# Patient Record
Sex: Male | Born: 1970 | Race: Black or African American | Hispanic: No | Smoking: Never smoker
Health system: Southern US, Community
[De-identification: ages and names within clinical notes are randomized; demographics above are authoritative.]

## PROBLEM LIST (undated history)

## (undated) DIAGNOSIS — I10 Essential (primary) hypertension: Secondary | ICD-10-CM

---

## 2017-09-25 ENCOUNTER — Ambulatory Visit
Admission: EM | Admit: 2017-09-25 | Discharge: 2017-09-25 | Disposition: A | Discharge status: Home / Self Care | Payer: BLUE CROSS/BLUE SHIELD | Attending: Family Medicine | Admitting: Family Medicine

## 2017-09-25 ENCOUNTER — Ambulatory Visit (INDEPENDENT_AMBULATORY_CARE_PROVIDER_SITE_OTHER): Payer: BLUE CROSS/BLUE SHIELD

## 2017-09-25 ENCOUNTER — Encounter: Payer: Self-pay | Admitting: Emergency Medicine

## 2017-09-25 ENCOUNTER — Other Ambulatory Visit: Payer: Self-pay

## 2017-09-25 DIAGNOSIS — M79645 Pain in left finger(s): Secondary | ICD-10-CM

## 2017-09-25 DIAGNOSIS — Z041 Encounter for examination and observation following transport accident: Secondary | ICD-10-CM

## 2017-09-25 HISTORY — DX: Essential (primary) hypertension: I10

## 2017-09-25 MED ORDER — MELOXICAM 15 MG PO TABS: 15.0000 mg | ORAL_TABLET | Freq: Every day | ORAL | 0 refills | Status: AC | PRN
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
## 2017-09-25 NOTE — Discharge Instructions (Signed)
Rest.  Medication as directed.  Take care  Dr. Burdett Pinzon  

## 2017-10-21 ENCOUNTER — Other Ambulatory Visit: Payer: Self-pay | Admitting: Family Medicine

## 2017-11-06 ENCOUNTER — Other Ambulatory Visit: Payer: Self-pay | Admitting: Family Medicine

## 2019-11-12 IMAGING — CR DG FINGER THUMB 2+V*L*
3 series · 3 of 3 positions shown · non-contrast
Comparison: None.

CLINICAL DATA: Thumb pain

EXAM:
LEFT THUMB 2+V

[finger ap]
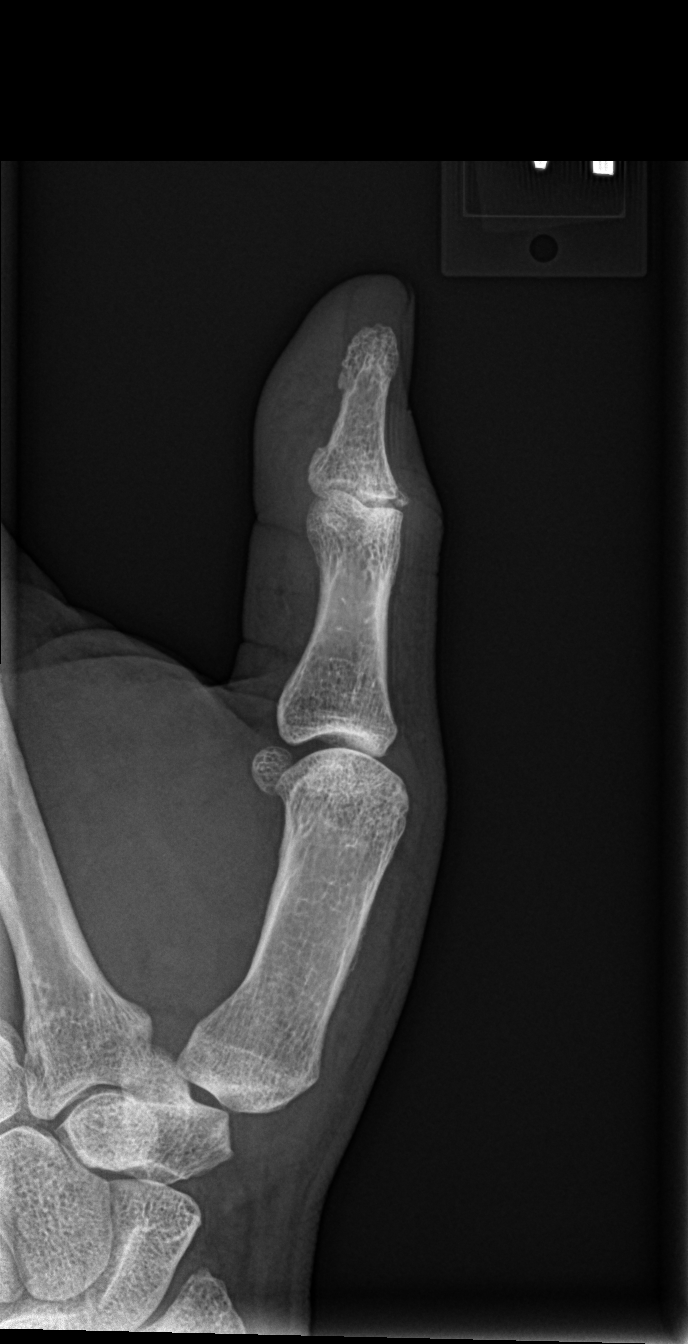

[finger obl]
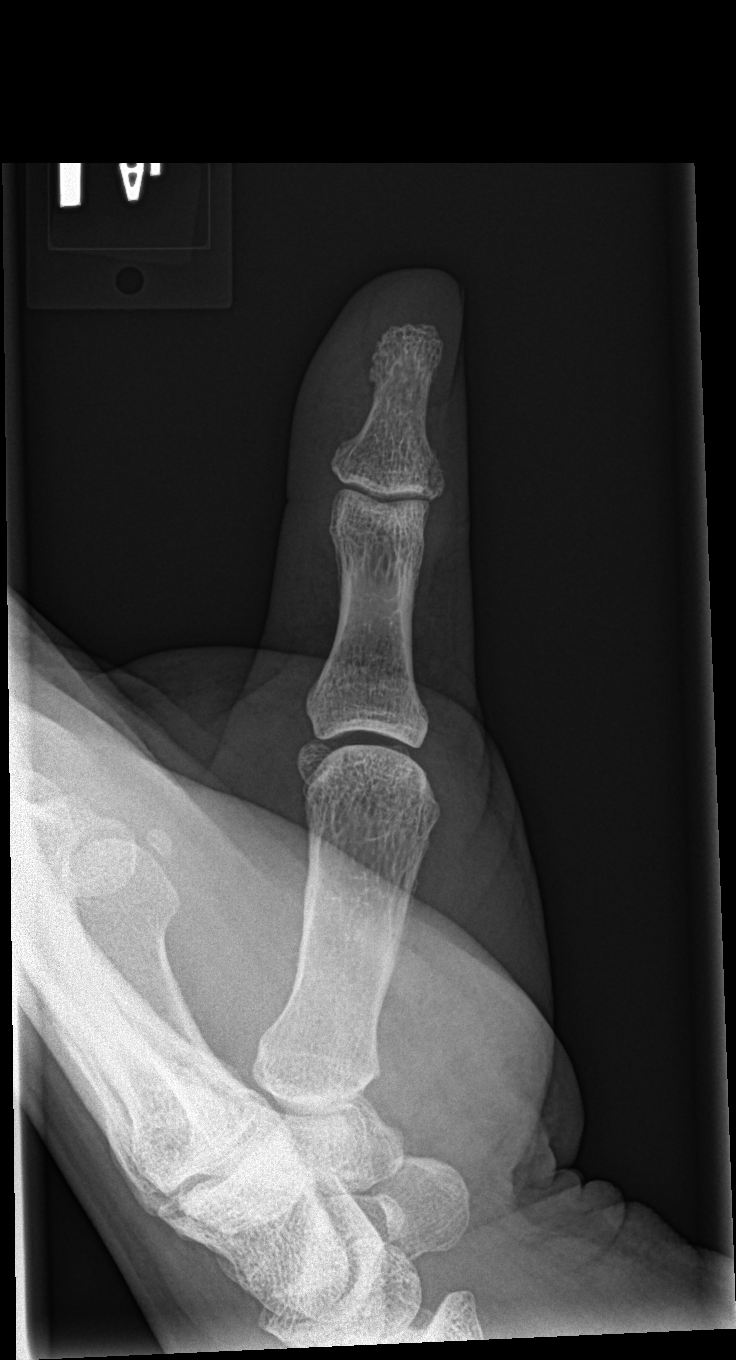

[finger lat]
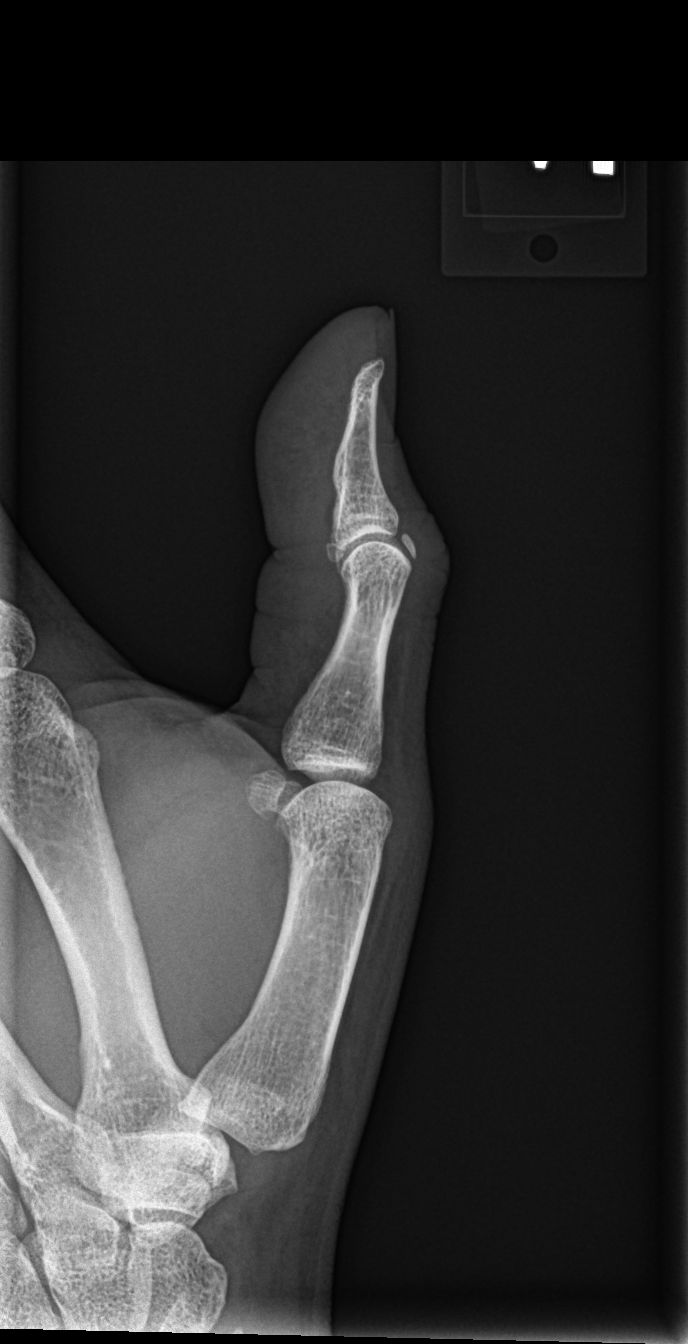

[3 of 3 positions shown; findings below may reference images not displayed]

FINDINGS: There is no evidence of fracture or dislocation. There is mild
osteoarthritis of the first CMC joint. Soft tissues are unremarkable
IMPRESSION: No acute osseous injury of the left thumb.
